# Patient Record
Sex: Male | Born: 1980 | Race: Black or African American | Hispanic: No | Marital: Married | Smoking: Current some day smoker
Health system: Southern US, Community
[De-identification: ages and names within clinical notes are randomized; demographics above are authoritative.]

## PROBLEM LIST (undated history)

## (undated) DIAGNOSIS — K469 Unspecified abdominal hernia without obstruction or gangrene: Secondary | ICD-10-CM

---

## 2013-11-01 ENCOUNTER — Emergency Department (HOSPITAL_BASED_OUTPATIENT_CLINIC_OR_DEPARTMENT_OTHER)
Admission: EM | Admit: 2013-11-01 | Discharge: 2013-11-01 | Disposition: A | Discharge status: Home / Self Care | Payer: Self-pay | Attending: Emergency Medicine | Admitting: Emergency Medicine

## 2013-11-01 ENCOUNTER — Encounter (HOSPITAL_BASED_OUTPATIENT_CLINIC_OR_DEPARTMENT_OTHER): Payer: Self-pay | Admitting: Emergency Medicine

## 2013-11-01 DIAGNOSIS — F17200 Nicotine dependence, unspecified, uncomplicated: Secondary | ICD-10-CM | POA: Insufficient documentation

## 2013-11-01 DIAGNOSIS — G500 Trigeminal neuralgia: Secondary | ICD-10-CM | POA: Insufficient documentation

## 2013-11-01 MED ORDER — CARBAMAZEPINE 200 MG PO TABS: ORAL_TABLET | ORAL | Status: DC

## 2013-11-01 NOTE — ED Provider Notes (Signed)
CSN: [id number]     Arrival date & time 11/01/13  0145 History   First MD Initiated Contact with Patient 11/01/13 0408     Chief Complaint  Patient presents with  . Headache     (Consider location/radiation/quality/duration/timing/severity/associated sxs/prior Treatment) HPI This is a 33 year old male with a week and a half history of pain on the right side of his head and face. The pain is paroxysmal, typically lasting about 20 seconds. He describes it as feeling like electricity or stabbing. It is located in about the ophthalmic and maxillary branches of the trigeminal nerve sparing the mandibular branch. The pain is often is triggered by touching the right side of his face or scalp and he finds it difficult to sleep on his right side. There is no associated visual change, motor deficit or sensory deficit. He did get lightheaded yesterday evening taking a shower but did not pass out. He states he is concerned because he has a friend who recently had a brain aneurysm rupture. He has been taking ibuprofen for the pain without significant relief. The pain has made it difficult to sleep. The paroxysms are severe at their worst.  History reviewed. No pertinent past medical history. History reviewed. No pertinent past surgical history. No family history on file. History  Substance Use Topics  . Smoking status: Current Some Day Smoker  . Smokeless tobacco: Not on file  . Alcohol Use: No    Review of Systems  All other systems reviewed and are negative.   Allergies  Review of patient's allergies indicates no known allergies.  Home Medications   Prior to Admission medications   Not on File   BP 159/89  Pulse 76  Temp(Src) 98.5 F (36.9 C) (Oral)  Resp 20  Ht 5\' 11"  (1.803 m)  Wt 260 lb (117.935 kg)  BMI 36.28 kg/m2  SpO2 98%  Physical Exam General: Well-developed, well-nourished male in no acute distress; appearance consistent with age of record HENT: normocephalic;  atraumatic Eyes: pupils equal, round and reactive to light; extraocular muscles intact Neck: supple Heart: regular rate and rhythm Lungs: clear to auscultation bilaterally Abdomen: soft; nondistended Extremities: No deformity; full range of motion Neurologic: Awake, alert and oriented; motor function intact in all extremities and symmetric; no facial droop; mild tenderness in the right zygomatic region, otherwise normal sensation of the face; normal coordination and speech; negative Romberg; normal finger to nose Skin: Warm and dry; no facial rash Psychiatric: Normal mood and affect    ED Course  Procedures (including critical care time)  MDM  History is consistent with trigeminal neuralgia we will start him on carbamazepine but he was advised that this requires long-term management and titration and we will refer him to neurology.    Hanley SeamenJohn L Dontay Harm, MD 11/01/13 [phone]

## 2013-11-01 NOTE — ED Notes (Signed)
C/o sharp rt side head pain and soreness x 1 1/2 weeks  Denies inj

## 2013-11-01 NOTE — Discharge Instructions (Signed)
Trigeminal Neuralgia Trigeminal neuralgia is a nerve disorder that causes sudden attacks of severe facial pain. It is caused by damage to the trigeminal nerve, a major nerve in the face. It is more common in women and in the elderly, although it can also happen in younger patients. Attacks last from a few seconds to several minutes and can occur from a couple of times per year to several times per day. Trigeminal neuralgia can be a very distressing and disabling condition. Surgery may be needed in very severe cases if medical treatment does not give relief. HOME CARE INSTRUCTIONS   If your caregiver prescribed medication to help prevent attacks, take as directed.  To help prevent attacks:  Chew on the unaffected side of the mouth.  Avoid touching your face.  Avoid blasts of hot or cold air.  Men may wish to grow a beard to avoid having to shave. SEEK IMMEDIATE MEDICAL CARE IF:  Pain is unbearable and your medicine does not help.  You develop new, unexplained symptoms (problems).  You have problems that may be related to a medication you are taking. Document Released: 06/02/2000 Document Revised: 08/28/2011 Document Reviewed: 04/02/2009 [employer] Patient Information 2014 [employer].  

## 2013-11-01 NOTE — ED Notes (Signed)
Right sided intermittent head pain for 1 1/2 weeks.  Very tender to touch. No rash.  Pt sts he got dizzy tonight in the shower tonight and "stumbled" out of it.  Caught himself on a table.

## 2014-05-22 ENCOUNTER — Emergency Department (HOSPITAL_BASED_OUTPATIENT_CLINIC_OR_DEPARTMENT_OTHER)
Admission: EM | Admit: 2014-05-22 | Discharge: 2014-05-22 | Disposition: A | Discharge status: Home / Self Care | Payer: Self-pay | Attending: Emergency Medicine | Admitting: Emergency Medicine

## 2014-05-22 ENCOUNTER — Emergency Department (HOSPITAL_BASED_OUTPATIENT_CLINIC_OR_DEPARTMENT_OTHER): Payer: Self-pay

## 2014-05-22 ENCOUNTER — Encounter (HOSPITAL_BASED_OUTPATIENT_CLINIC_OR_DEPARTMENT_OTHER): Payer: Self-pay

## 2014-05-22 DIAGNOSIS — W01198A Fall on same level from slipping, tripping and stumbling with subsequent striking against other object, initial encounter: Secondary | ICD-10-CM | POA: Insufficient documentation

## 2014-05-22 DIAGNOSIS — Z79899 Other long term (current) drug therapy: Secondary | ICD-10-CM | POA: Insufficient documentation

## 2014-05-22 DIAGNOSIS — S60512A Abrasion of left hand, initial encounter: Secondary | ICD-10-CM | POA: Insufficient documentation

## 2014-05-22 DIAGNOSIS — Y9289 Other specified places as the place of occurrence of the external cause: Secondary | ICD-10-CM | POA: Insufficient documentation

## 2014-05-22 DIAGNOSIS — S62347A Nondisplaced fracture of base of fifth metacarpal bone. left hand, initial encounter for closed fracture: Secondary | ICD-10-CM | POA: Insufficient documentation

## 2014-05-22 DIAGNOSIS — T1490XA Injury, unspecified, initial encounter: Secondary | ICD-10-CM

## 2014-05-22 DIAGNOSIS — Z23 Encounter for immunization: Secondary | ICD-10-CM | POA: Insufficient documentation

## 2014-05-22 DIAGNOSIS — Z791 Long term (current) use of non-steroidal anti-inflammatories (NSAID): Secondary | ICD-10-CM | POA: Insufficient documentation

## 2014-05-22 DIAGNOSIS — Y9389 Activity, other specified: Secondary | ICD-10-CM | POA: Insufficient documentation

## 2014-05-22 DIAGNOSIS — Y998 Other external cause status: Secondary | ICD-10-CM | POA: Insufficient documentation

## 2014-05-22 DIAGNOSIS — S62309A Unspecified fracture of unspecified metacarpal bone, initial encounter for closed fracture: Secondary | ICD-10-CM

## 2014-05-22 DIAGNOSIS — Z87891 Personal history of nicotine dependence: Secondary | ICD-10-CM | POA: Insufficient documentation

## 2014-05-22 MED ORDER — HYDROCODONE-ACETAMINOPHEN 5-325 MG PO TABS
1.0000 | ORAL_TABLET | Freq: Four times a day (QID) | ORAL | Status: DC | PRN
Start: 2014-05-22 — End: 2016-02-12

## 2014-05-22 MED ORDER — HYDROCODONE-ACETAMINOPHEN 5-325 MG PO TABS
1.0000 | ORAL_TABLET | Freq: Once | ORAL | Status: AC
  Administered 2014-05-22: 1 via ORAL

## 2014-05-22 MED ORDER — TETANUS-DIPHTH-ACELL PERTUSSIS 5-2.5-18.5 LF-MCG/0.5 IM SUSP
0.5000 mL | Freq: Once | INTRAMUSCULAR | Status: AC
  Administered 2014-05-22: 0.5 mL via INTRAMUSCULAR

## 2014-05-22 MED ORDER — NAPROXEN 500 MG PO TABS: 500.0000 mg | ORAL_TABLET | Freq: Two times a day (BID) | ORAL | Status: DC

## 2014-05-22 NOTE — ED Notes (Signed)
Pt reports was attempting to stop dog and fell, hand hit concrete, swelling and pain, abrasions to L hand.

## 2014-05-22 NOTE — Discharge Instructions (Signed)
Keep your splint in place and dry. Follow-up with orthopedic hand surgery. Dr. Janee Mornhompson and phone number and address provided above. Keep the hand elevated as much as possible for the next 2 days. Take pain medication and anti-inflammatory medicine as directed. Work note provided.

## 2014-05-22 NOTE — ED Notes (Signed)
Ulnar splint applied to left hand, + cms, able to move digits,

## 2014-05-22 NOTE — ED Provider Notes (Addendum)
CSN: [id number]     Arrival date & time 05/22/14  2019 History  This chart was scribed for Jonathan Mata Jonathan Dorce, MD by Abel PrestoKara Demonbreun, ED Scribe. This patient was seen in room MH12/MH12 and the patient's care was started at 10:22 PM.    Chief Complaint  Patient presents with  . Hand Injury     The history is provided by the patient. No language interpreter was used.    HPI Comments: Jonathan Mata is a 33 y.o. male who presents to the Emergency Department complaining of 9/10 left hand pain on lateral portion after falling on hand around 8:15pm. Pt was walking dog when he fell. Pt notes associated pain and abrasions on hand. Pt is left hand dominant. Pt has not had a tetanus shot in last 10 years. Pt denies LOC, fever, chills, cold symptoms, dysuria, abdominal, chest and back pain, and any other injuries.   History reviewed. No pertinent past medical history. History reviewed. No pertinent past surgical history. No family history on file. History  Substance Use Topics  . Smoking status: Former Games developermoker  . Smokeless tobacco: Not on file  . Alcohol Use: No    Review of Systems  Constitutional: Negative for fever and chills.  HENT: Negative for rhinorrhea and sore throat.   Eyes: Negative for visual disturbance.  Respiratory: Negative for cough and shortness of breath.   Cardiovascular: Negative for chest pain and leg swelling.  Gastrointestinal: Negative for nausea, vomiting, abdominal pain and diarrhea.  Genitourinary: Negative for dysuria.  Musculoskeletal: Negative for back pain and neck pain.  Skin: Negative for rash.  Neurological: Negative for syncope and headaches.  Hematological: Does not bruise/bleed easily.  Psychiatric/Behavioral: Negative for confusion.      Allergies  Review of patient's allergies indicates no known allergies.  Home Medications   Prior to Admission medications   Medication Sig Start Date End Date Taking? Authorizing Provider  carbamazepine  (TEGRETOL) 200 MG tablet 1 tablet daily for one week then start taking one twice daily. 11/01/13   Carlisle BeersJohn L Molpus, MD  HYDROcodone-acetaminophen (NORCO/VICODIN) 5-325 MG per tablet Take 1-2 tablets by mouth every 6 (six) hours as needed for moderate pain. 05/22/14   Jonathan Mata Adriahna Shearman, MD  naproxen (NAPROSYN) 500 MG tablet Take 1 tablet (500 mg total) by mouth 2 (two) times daily. 05/22/14   Jonathan Mata Makita Blow, MD   BP 110/82 mmHg  Pulse 92  Temp(Src) 98.4 F (36.9 C) (Oral)  Resp 18  Ht 5\' 11"  (1.803 m)  Wt 240 lb (108.863 kg)  BMI 33.49 kg/m2  SpO2 96% Physical Exam  Constitutional: He is oriented to person, place, and time. He appears well-developed and well-nourished.  HENT:  Head: Normocephalic.  Mouth/Throat: Oropharynx is clear and moist.  Eyes: Conjunctivae and EOM are normal. Pupils are equal, round, and reactive to light. No scleral icterus.  Neck: Normal range of motion. Neck supple.  Cardiovascular: Normal rate, regular rhythm and normal heart sounds.   No murmur heard. Pulses:      Radial pulses are 2+ on the left side.  Cap refill in fingertips 1 sec  Pulmonary/Chest: Effort normal and breath sounds normal. No respiratory distress. He has no wheezes. He has no rales.  Abdominal: Soft. Bowel sounds are normal. There is no tenderness.  Musculoskeletal: Normal range of motion. He exhibits no edema (in ankles).  Swelling at base of 4th and 5th metacarpals Abrasions on dorsum of hand  ROM normal  Neurological: He is alert and oriented to person,  place, and time. No cranial nerve deficit. He exhibits normal muscle tone. Coordination normal.  Sensations intact  Skin: Skin is warm and dry.  Psychiatric: He has a normal mood and affect. His behavior is normal.  Nursing note and vitals reviewed.   ED Course  Procedures (including critical care time) DIAGNOSTIC STUDIES: Oxygen Saturation is 96% on room air, normal by my interpretation.    COORDINATION OF CARE: 10:28 PM Discussed  treatment plan with patient at beside, the patient agrees with the plan and has no further questions at this time.     Labs Review Labs Reviewed - No data to display  Imaging Review Dg Hand Complete Left  05/22/2014   CLINICAL DATA:  33 year old male with history of trauma from a fall while trying to stop a dog, with injury to the left hand on concrete complaining of pain and swelling in the left hand. Pain is most severe in the left ring finger and fifth finger.  EXAM: LEFT HAND - COMPLETE 3+ VIEW  COMPARISON:  No priors.  FINDINGS: Nondisplaced comminuted fracture through the base of the fifth metacarpal, with extensive overlying soft tissue swelling. No other acute displaced fracture, subluxation or dislocation is noted.  IMPRESSION: Nondisplaced comminuted fracture through the base of the fifth metacarpal.   Electronically Signed   By: Trudie Reedaniel  Entrikin M.D.   On: 05/22/2014 21:33     EKG Interpretation None      MDM   Final diagnoses:  Metacarpal bone fracture, closed, initial encounter      Patient with a fall resulting in fifth the carpal base fracture. Closed fracture. Neurovascularly intact good range of motion of the fingers. Will be splinted with a short arm splint predominantly ulnar gutter. Some abrasions in the webspaces between the fifth and fourth and third fingers. They are not deep laceration superficially did not occur from dog bite. Patient tetanus updated here today. Hand was soaked in local wound care provided. Referral and follow-up with orthopedics. No other injuries.  I personally performed the services described in this documentation, which was scribed in my presence. The recorded information has been reviewed and is accurate.    Jonathan Mata [name], MD 05/22/14 [id number]  Jonathan Mata [name], MD 05/22/14 2306

## 2016-02-11 ENCOUNTER — Emergency Department (HOSPITAL_BASED_OUTPATIENT_CLINIC_OR_DEPARTMENT_OTHER): Payer: Self-pay

## 2016-02-11 ENCOUNTER — Encounter (HOSPITAL_BASED_OUTPATIENT_CLINIC_OR_DEPARTMENT_OTHER): Payer: Self-pay | Admitting: *Deleted

## 2016-02-11 ENCOUNTER — Observation Stay (HOSPITAL_BASED_OUTPATIENT_CLINIC_OR_DEPARTMENT_OTHER)
Admission: EM | Admit: 2016-02-11 | Discharge: 2016-02-12 | Disposition: A | Discharge status: Home / Self Care | Payer: Self-pay | Attending: Internal Medicine | Admitting: Internal Medicine

## 2016-02-11 DIAGNOSIS — R531 Weakness: Secondary | ICD-10-CM | POA: Insufficient documentation

## 2016-02-11 DIAGNOSIS — R079 Chest pain, unspecified: Secondary | ICD-10-CM | POA: Diagnosis present

## 2016-02-11 DIAGNOSIS — R42 Dizziness and giddiness: Secondary | ICD-10-CM

## 2016-02-11 DIAGNOSIS — R9431 Abnormal electrocardiogram [ECG] [EKG]: Principal | ICD-10-CM | POA: Insufficient documentation

## 2016-02-11 DIAGNOSIS — N179 Acute kidney failure, unspecified: Secondary | ICD-10-CM

## 2016-02-11 DIAGNOSIS — Z87891 Personal history of nicotine dependence: Secondary | ICD-10-CM | POA: Insufficient documentation

## 2016-02-11 DIAGNOSIS — R0789 Other chest pain: Secondary | ICD-10-CM | POA: Insufficient documentation

## 2016-02-11 DIAGNOSIS — G4733 Obstructive sleep apnea (adult) (pediatric): Secondary | ICD-10-CM | POA: Insufficient documentation

## 2016-02-11 LAB — HEPATIC FUNCTION PANEL
ALBUMIN: 4 g/dL (ref 3.5–5.0)
ALT: 34 U/L (ref 17–63)
AST: 24 U/L (ref 15–41)
Alkaline Phosphatase: 61 U/L (ref 38–126)
BILIRUBIN TOTAL: 0.1 mg/dL — AB (ref 0.3–1.2)
Bilirubin, Direct: <0.1 mg/dL — ABNORMAL LOW (ref 0.1–0.5)
TOTAL PROTEIN: 7.6 g/dL (ref 6.5–8.1)

## 2016-02-11 LAB — CBC
HCT: 41.7 % (ref 39.0–52.0)
Hemoglobin: 13.8 g/dL (ref 13.0–17.0)
MCH: 27 pg (ref 26.0–34.0)
MCHC: 33.1 g/dL (ref 30.0–36.0)
MCV: 81.4 fL (ref 78.0–100.0)
Platelets: 213 10*3/uL (ref 150–400)
RBC: 5.12 MIL/uL (ref 4.22–5.81)
RDW: 14 % (ref 11.5–15.5)
WBC: 8.4 10*3/uL (ref 4.0–10.5)

## 2016-02-11 LAB — TROPONIN I

## 2016-02-11 LAB — BASIC METABOLIC PANEL
Anion gap: 6 (ref 5–15)
BUN: 12 mg/dL (ref 6–20)
CHLORIDE: 106 mmol/L (ref 101–111)
CO2: 29 mmol/L (ref 22–32)
Calcium: 9.4 mg/dL (ref 8.9–10.3)
Creatinine, Ser: 1.44 mg/dL — ABNORMAL HIGH (ref 0.61–1.24)
GFR calc Af Amer: >60 mL/min (ref >60)
GFR calc non Af Amer: >60 mL/min (ref >60)
Glucose, Bld: 104 mg/dL — ABNORMAL HIGH (ref 65–99)
POTASSIUM: 3.9 mmol/L (ref 3.5–5.1)
Sodium: 141 mmol/L (ref 135–145)

## 2016-02-11 LAB — CBG MONITORING, ED: Glucose-Capillary: 105 mg/dL — ABNORMAL HIGH (ref 65–99)

## 2016-02-11 LAB — RAPID URINE DRUG SCREEN, HOSP PERFORMED
Amphetamines: NOT DETECTED
Barbiturates: NOT DETECTED
Benzodiazepines: NOT DETECTED
Cocaine: NOT DETECTED
Opiates: NOT DETECTED
Tetrahydrocannabinol: NOT DETECTED

## 2016-02-11 LAB — URINALYSIS, ROUTINE W REFLEX MICROSCOPIC
BILIRUBIN URINE: NEGATIVE
GLUCOSE, UA: NEGATIVE mg/dL
Hgb urine dipstick: NEGATIVE
KETONES UR: NEGATIVE mg/dL
LEUKOCYTES UA: NEGATIVE
Nitrite: NEGATIVE
Protein, ur: NEGATIVE mg/dL
Specific Gravity, Urine: 1.027 (ref 1.005–1.030)
pH: 5.5 (ref 5.0–8.0)

## 2016-02-11 NOTE — ED Notes (Signed)
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 The patient reports that he started to have a headache a few days ago to the right side of his head. Patient denies any other problems

## 2016-02-11 NOTE — ED Triage Notes (Signed)
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 He was feeling better after getting home. Tonight while eating he had severe head pain, dizziness and chest pain. States his left hand has been getting numb and off and on since last night. Vomiting tonight.

## 2016-02-11 NOTE — ED Provider Notes (Signed)
[facility] Provider Note   CSN: [id number] Arrival date & time: 02/11/16  2122  By signing my name below, I, Soijett Blue, attest that this documentation has been prepared under the direction and in the presence of Arby Barrette, MD. Electronically Signed: Soijett Blue, ED Scribe. 02/11/16. 10:16 PM.    History   Chief Complaint Chief Complaint  Patient presents with  . Dizziness    HPI  Jonathan Mata is a 35 y.o. male who presents to the Emergency Department complaining of dizziness onset last night. 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 He states that he is having associated symptoms of mild HA, mild resolved CP, intermittent left sided numbness x last night, and generalized weakness. Pt notes that he had CP that has since resolved. He states that he has not tried any medications for the relief for his symptoms. He denies SOB, and any other symptoms. Denies ETOH or cigarette smoker. Denies family medical issues of cardiac issues. Denies PMHx of HTN, DM, or high cholesterol.      The history is provided by the patient. No language interpreter was used.    History reviewed. No pertinent past medical history.  Patient Active Problem List   Diagnosis Date Noted  . Chest pain 02/12/2016    History reviewed. No pertinent surgical history.     Home Medications    Prior to Admission medications   Medication Sig Start Date End Date Taking? Authorizing Provider  carbamazepine (TEGRETOL) 200 MG tablet 1 tablet daily for one week then start taking one twice daily. 11/01/13   John Molpus, MD  HYDROcodone-acetaminophen (NORCO/VICODIN) 5-325 MG per tablet Take 1-2 tablets by mouth every 6 (six) hours as needed for moderate pain. 05/22/14   Vanetta Mulders, MD  naproxen (NAPROSYN) 500 MG tablet Take 1 tablet (500 mg  total) by mouth 2 (two) times daily. 05/22/14   Vanetta Mulders, MD    Family History No family history on file.  Social History Social History  Substance Use Topics  . Smoking status: Current Some Day Smoker  . Smokeless tobacco: Never Used  . Alcohol use No     Allergies   Review of patient's allergies indicates no known allergies.   Review of Systems Review of Systems  Respiratory: Negative for shortness of breath.   Cardiovascular: Positive for chest pain (resolved).  Neurological: Positive for dizziness, weakness, numbness and headaches.   10 Systems reviewed and are negative for acute change except as noted in the HPI.  Physical Exam Updated Vital Signs BP 137/87   Pulse (!) 58   Temp 98 F (36.7 C)   Resp 18   Ht 5\' 11"  (1.803 m)   Wt 240 lb (108.9 kg)   SpO2 98%   BMI 33.47 kg/m   Physical Exam  Constitutional: He is oriented to person, place, and time. He appears well-developed and well-nourished. No distress.  HENT:  Head: Normocephalic and atraumatic.  Eyes: EOM are normal.  Neck: Neck supple.  Cardiovascular: Normal rate, regular rhythm and normal heart sounds.  Exam reveals no gallop and no friction rub.   No murmur heard. Pulmonary/Chest: Effort normal and breath sounds normal. No respiratory distress. He has no wheezes. He has no rales.  Abdominal: Soft. He exhibits no distension. There is no tenderness.  Musculoskeletal: Normal range of motion.  Neurological: He is alert and oriented to person, place, and time. He exhibits normal muscle tone. Coordination normal.  No abnormal coordination or tone. 5/5 strength in bilateral upper and lower extremities. No pronator drift. Nl finger-to-nose bilaterally. Sensation intact to light touch.   Skin: Skin is warm and dry.  Psychiatric: He has a normal mood and affect. His behavior is normal.  Nursing note and vitals reviewed.    ED Treatments / Results  DIAGNOSTIC STUDIES: Oxygen Saturation is 98% on  RA, nl by my interpretation.    COORDINATION OF CARE: 10:16 PM Discussed treatment plan with pt at bedside which includes labs, UA, EKG, and pt agreed to plan.   Labs (all labs ordered are listed, but only abnormal results are displayed) Labs Reviewed  BASIC METABOLIC PANEL - Abnormal; Notable for the following:       Result Value   Glucose, Bld 104 (*)    Creatinine, Ser 1.44 (*)    All other components within normal limits  HEPATIC FUNCTION PANEL - Abnormal; Notable for the following:    Total Bilirubin 0.1 (*)    Bilirubin, Direct <0.1 (*)    All other components within normal limits  CBG MONITORING, ED - Abnormal; Notable for the following:    Glucose-Capillary 105 (*)    All other components within normal limits  CBC  URINALYSIS, ROUTINE W REFLEX MICROSCOPIC (NOT AT [facility])  TROPONIN I  URINE RAPID DRUG SCREEN, HOSP PERFORMED    EKG  EKG Interpretation  Date/Time:  Friday February 11 2016 21:30:28 EDT Ventricular Rate:  75 PR Interval:  120 QRS Duration: 94 QT Interval:  340 QTC Calculation: 379 R Axis:   12 Text Interpretation:  Normal sinus rhythm T wave abnormality, consider inferior ischemia T wave abnormality, consider anterolateral ischemia Abnormal ECG ischemic appearance. no old comparison Confirmed by Donnald GarrePfeiffer, MD, Lebron ConnersMarcy [phone]([id number]) on 02/11/2016 9:37:36 PM       Radiology Ct Head Wo Contrast  Result Date: 02/11/2016 CLINICAL DATA:  Posterior head pain. Dizziness. Left hand numbness. Vomiting EXAM: CT HEAD WITHOUT CONTRAST TECHNIQUE: Contiguous axial images were obtained from the base of the skull through the vertex without intravenous contrast. COMPARISON:  None. FINDINGS: Brain: There is no mass lesion, intraparenchymal hemorrhage or extra-axial collection. No evidence of acute cortical infarct. No hydrocephalus. Brain parenchyma and CSF-containing spaces are normal for age. Vascular: No hyperdense vessel or unexpected calcification. Skull: Normal Sinuses/Orbits:  Sinuses and mastoids are clear. Visualized orbits are normal. Other: The visualized extracranial soft tissues are unremarkable. Nasopharyngeal fullness is likely secondary to prominent adenoidal tissue. IMPRESSION: Normal head CT. Electronically Signed   By: Deatra RobinsonKevin  Herman M.D.   On: 02/11/2016 22:25    Procedures Procedures (including critical care time)  Medications Ordered in ED Medications  aspirin chewable tablet 324 mg (not administered)  acetaminophen (TYLENOL) tablet 1,000 mg (not administered)     Initial Impression / Assessment and Plan / ED Course  I have reviewed the triage vital signs and the nursing notes.  Pertinent labs & imaging results that were available during my care of the patient were reviewed by me and considered in my medical decision making (see chart for details).  Clinical Course   Consult :Dr. Clyde LundborgNiu for admission   Final Clinical Impressions(s) / ED Diagnoses   Final diagnoses:  EKG abnormalities  Chest pressure  Dizziness   Patient has had a constellation of symptoms that initially began with feeling of general weakness  with variable feeling of left  arm numbness and chest pressure. Patient reports at the time he felt terrible. Severity of this has waxed and waned. He is also intermittently had headache that has waxed and waned. No focal neurologic dysfunction. Patient does have abnormal EKG with nonspecific T-wave changes. At this time my main concern is for cardiac possible ischemic symptoms waxing and waning. The patient is currently chest pain-free and has no evidence of neurologic dysfunction. Plan will be for hospital observation. New Prescriptions New Prescriptions   No medications on file       Arby Barrette, MD 02/12/16 [phone]

## 2016-02-12 ENCOUNTER — Observation Stay (HOSPITAL_COMMUNITY): Payer: Self-pay

## 2016-02-12 ENCOUNTER — Encounter (HOSPITAL_COMMUNITY): Payer: Self-pay | Admitting: *Deleted

## 2016-02-12 ENCOUNTER — Observation Stay (HOSPITAL_BASED_OUTPATIENT_CLINIC_OR_DEPARTMENT_OTHER): Payer: Self-pay

## 2016-02-12 DIAGNOSIS — R079 Chest pain, unspecified: Secondary | ICD-10-CM

## 2016-02-12 DIAGNOSIS — N179 Acute kidney failure, unspecified: Secondary | ICD-10-CM

## 2016-02-12 LAB — LIPASE, BLOOD: LIPASE: 15 U/L (ref 11–51)

## 2016-02-12 LAB — TROPONIN I

## 2016-02-12 LAB — NM MYOCAR MULTI W/SPECT W/WALL MOTION / EF
CHL CUP MPHR: 186 {beats}/min
CHL CUP RESTING HR STRESS: 68 {beats}/min
CSEPED: 5 min
CSEPEW: 1 METS
Exercise duration (sec): 36 s
Peak HR: 131 {beats}/min
Percent HR: 70 %

## 2016-02-12 LAB — D-DIMER, QUANTITATIVE: D-Dimer, Quant: <0.27 ug/mL-FEU (ref 0.00–0.50)

## 2016-02-12 MED ORDER — SODIUM CHLORIDE 0.9 % IV SOLN
INTRAVENOUS | Status: DC
  Administered 2016-02-12: 11:00:00 via INTRAVENOUS

## 2016-02-12 MED ORDER — ONDANSETRON HCL 4 MG/2ML IJ SOLN: 4.0000 mg | Freq: Four times a day (QID) | INTRAMUSCULAR | Status: DC | PRN

## 2016-02-12 MED ORDER — ENOXAPARIN SODIUM 40 MG/0.4ML ~~LOC~~ SOLN
40.0000 mg | SUBCUTANEOUS | Status: DC
  Administered 2016-02-12: 40 mg via SUBCUTANEOUS
  Filled 2016-02-12: qty 0.4

## 2016-02-12 MED ORDER — ACETAMINOPHEN 325 MG PO TABS: 650.0000 mg | ORAL_TABLET | ORAL | Status: DC | PRN

## 2016-02-12 MED ORDER — ASPIRIN 81 MG PO CHEW
324.0000 mg | CHEWABLE_TABLET | Freq: Once | ORAL | Status: AC
  Administered 2016-02-12: 324 mg via ORAL
  Filled 2016-02-12: qty 4

## 2016-02-12 MED ORDER — OMEPRAZOLE 40 MG PO CPDR: 40.0000 mg | DELAYED_RELEASE_CAPSULE | Freq: Every day | ORAL | 0 refills | Status: AC

## 2016-02-12 MED ORDER — REGADENOSON 0.4 MG/5ML IV SOLN
0.4000 mg | Freq: Once | INTRAVENOUS | Status: AC
  Administered 2016-02-12: 0.4 mg via INTRAVENOUS
  Filled 2016-02-12: qty 5

## 2016-02-12 MED ORDER — MORPHINE SULFATE (PF) 2 MG/ML IV SOLN: 2.0000 mg | INTRAVENOUS | Status: DC | PRN

## 2016-02-12 MED ORDER — ACETAMINOPHEN 325 MG PO TABS: 650.0000 mg | ORAL_TABLET | ORAL | 0 refills | Status: AC | PRN

## 2016-02-12 MED ORDER — FAMOTIDINE 20 MG PO TABS
20.0000 mg | ORAL_TABLET | Freq: Every day | ORAL | Status: DC
Start: 2016-02-12 — End: 2016-02-12
  Administered 2016-02-12: 20 mg via ORAL
  Filled 2016-02-12: qty 1

## 2016-02-12 MED ORDER — GI COCKTAIL ~~LOC~~: 30.0000 mL | Freq: Four times a day (QID) | ORAL | Status: DC | PRN

## 2016-02-12 MED ORDER — ALPRAZOLAM 0.25 MG PO TABS: 0.2500 mg | ORAL_TABLET | Freq: Two times a day (BID) | ORAL | Status: DC | PRN

## 2016-02-12 MED ORDER — ACETAMINOPHEN 500 MG PO TABS
1000.0000 mg | ORAL_TABLET | Freq: Once | ORAL | Status: AC
  Administered 2016-02-12: 1000 mg via ORAL
  Filled 2016-02-12: qty 2

## 2016-02-12 MED ORDER — ASPIRIN EC 81 MG PO TBEC
81.0000 mg | DELAYED_RELEASE_TABLET | Freq: Every day | ORAL | Status: DC
  Administered 2016-02-12: 81 mg via ORAL
  Filled 2016-02-12: qty 1

## 2016-02-12 MED ORDER — TECHNETIUM TC 99M TETROFOSMIN IV KIT
30.0000 | PACK | Freq: Once | INTRAVENOUS | Status: AC | PRN
  Administered 2016-02-12: 30 via INTRAVENOUS

## 2016-02-12 MED ORDER — ZOLPIDEM TARTRATE 5 MG PO TABS: 5.0000 mg | ORAL_TABLET | Freq: Every evening | ORAL | Status: DC | PRN

## 2016-02-12 MED ORDER — TECHNETIUM TC 99M TETROFOSMIN IV KIT
10.0000 | PACK | Freq: Once | INTRAVENOUS | Status: AC | PRN
  Administered 2016-02-12: 10 via INTRAVENOUS

## 2016-02-12 MED ORDER — REGADENOSON 0.4 MG/5ML IV SOLN
INTRAVENOUS | Status: AC
  Filled 2016-02-12: qty 5

## 2016-02-12 NOTE — Progress Notes (Signed)
[facility] Broadus JohnPfieffer  35 year old man, without a significant past medical history, who presents with pressure-like chest pain, left-handed numbness, headache, dizziness. Also has nausea no vomiting. Pt is a [employer] architecttruck driver.  Troponin negative, EKG has T-wave inversion in V3-V6 and inferior leads, negative UDS, negative urinalysis, WBCs 8.4, AKI with Cre 1.44. CT head is negative for acute intracranial abnormalities. Pt is accepted to tele bed for obs.  Lorretta HarpXilin Jaque Dacy, MD  [facility] Pager [phone]  If 7PM-7AM, please contact night-coverage [email or web address] Password [email or web address] 02/12/2016, 1:22 AM

## 2016-02-12 NOTE — H&P (Signed)
History and Physical    Jonathan Cocontoine Shelp ZOX:[id number] DOB: 03/12/1981 DOA: 02/11/2016   PCP: No PCP Per Patient   Patient coming from:  Home   Chief Complaint: Chest pain   HPI: Jonathan Mata is a 35 y.o. male without documented prior medical history, transferred [facility] after presenting with pressure-like chest pain, L handed tingling, headache and dizziness. Patient took ASA 324 mg on arrival, and then given NTG with no significant relief. Since admission, he continues to have intermittent  Non-specific chest discomfort  episodes. Denies any falls. Denies dyspnea or cough or pleuritic chest pain . Denies any fever or chills. Denies any nausea, vomiting or abdominal pain. Appetite is normal andeats salt rich foods. Denies any leg swelling or calf pain. Denies any vision changes. Denies any seizures or confusion. Never seen by a cardiologist. 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 In addition, he had a death in the family 3 weeks ago. No new meds. Not on hormonal  or herbal supplements. He smoked until 1 week ago 1 1/2-2 ppd  No ETOH or recreational drugs. Recent tooth abscess removal    ED Course:  BP 133/82 (BP Location: Left Arm)   Pulse 68   Temp 98.5 F (36.9 C) (Oral)   Resp 17   Ht 5\' 11"  (1.803 m)   Wt 108.9 kg (240 lb)   SpO2 100%   BMI 33.47 kg/m   EKG with T-wave inversion in V3-C6 and inferior leads Urine drug screen is negative  Troponin negative, Urinalysis is negative White count is normal at 8.4 Creatinine 1.44 CT of the head negative for acute intracranial abnormalities  Review of Systems: As per HPI otherwise 10 point review of systems negative.   History reviewed. No pertinent past medical history.  History reviewed. No pertinent surgical history.  Social History Social History   Social History  . Marital status: Single    Spouse name: N/A  . Number of  children: N/A  . Years of education: N/A   Occupational History  . truckdriver    Social History Main Topics  . Smoking status: Current Some Day Smoker  . Smokeless tobacco: Never Used  . Alcohol use No  . Drug use: No  . Sexual activity: Yes   Other Topics Concern  . Not on file   Social History Narrative  . No narrative on file     No Known Allergies  Family History  Problem Relation Age of Onset  . Heart murmur Mother   . Hypertension Mother       Prior to Admission medications   Medication Sig Start Date End Date Taking? Authorizing Provider  carbamazepine (TEGRETOL) 200 MG tablet 1 tablet daily for one week then start taking one twice daily. 11/01/13   John Molpus, MD  HYDROcodone-acetaminophen (NORCO/VICODIN) 5-325 MG per tablet Take 1-2 tablets by mouth every 6 (six) hours as needed for moderate pain. 05/22/14   Vanetta MuldersScott Zackowski, MD  naproxen (NAPROSYN) 500 MG tablet Take 1 tablet (500 mg total) by mouth 2 (two) times daily. 05/22/14   Vanetta MuldersScott Zackowski, MD    Physical Exam:    Vitals:   02/12/16 0409 02/12/16 0430 02/12/16 0500 02/12/16 0603  BP: 131/98 130/84 125/86 133/82  Pulse: 65 64 67 68  Resp: 16 17 19 17   Temp:    98.5 F (36.9 C)  TempSrc:    Oral  SpO2: 99% 98% 98% 100%  Weight:      Height:           Constitutional: NAD, calm, comfortable   Vitals:   02/12/16 0409 02/12/16 0430 02/12/16 0500 02/12/16 0603  BP: 131/98 130/84 125/86 133/82  Pulse: 65 64 67 68  Resp: 16 17 19 17   Temp:    98.5 F (36.9 C)  TempSrc:    Oral  SpO2: 99% 98% 98% 100%  Weight:      Height:       Eyes: PERRL, lids and conjunctivae normal ENMT: Mucous membranes are moist. Posterior pharynx clear of any exudate or lesions.Normal dentition.  Neck: normal, supple, no masses, no thyromegaly Respiratory: clear to auscultation bilaterally, no wheezing, no crackles. Normal respiratory effort. No accessory muscle use.  Cardiovascular: Regular rate and rhythm, no  murmurs / rubs / gallops. No extremity edema. 2+ pedal pulses. No carotid bruits.  Abdomen:  Obese, tenderness, no masses palpated. No hepatosplenomegaly. Bowel sounds positive.  Musculoskeletal: no clubbing / cyanosis. No joint deformity upper and lower extremities. Good ROM, no contractures. Normal muscle tone.  Skin: no rashes, lesions, ulse cers.  Neurologic: CN 2-12 grossly intact. Sensation intact, DTR normal. Strength 5/5 in all 4.  Psychiatric: Normal judgment and insight. Alert and oriented x 3. Normal mood.     Labs on Admission: I have personally reviewed following labs and imaging studies  CBC:  Recent Labs Lab 02/11/16 2200  WBC 8.4  HGB 13.8  HCT 41.7  MCV 81.4  PLT 213    Basic Metabolic Panel:  Recent Labs Lab 02/11/16 2200  NA 141  K 3.9  CL 106  CO2 29  GLUCOSE 104*  BUN 12  CREATININE 1.44*  CALCIUM 9.4    GFR: Estimated Creatinine Clearance: 90.7 mL/min (by C-G formula based on SCr of 1.44 mg/dL).  Liver Function Tests:  Recent Labs Lab 02/11/16 2200  AST 24  ALT 34  ALKPHOS 61  BILITOT 0.1*  PROT 7.6  ALBUMIN 4.0   No results for input(s): LIPASE, AMYLASE in the last 168 hours. No results for input(s): AMMONIA in the last 168 hours.  Coagulation Profile: No results for input(s): INR, PROTIME in the last 168 hours.  Cardiac Enzymes:  Recent Labs Lab 02/11/16 2200  TROPONINI <0.03    BNP (last 3 results) No results for input(s): PROBNP in the last 8760 hours.  HbA1C: No results for input(s): HGBA1C in the last 72 hours.  CBG:  Recent Labs Lab 02/11/16 2200  GLUCAP 105*    Lipid Profile: No results for input(s): CHOL, HDL, LDLCALC, TRIG, CHOLHDL, LDLDIRECT in the last 72 hours.  Thyroid Function Tests: No results for input(s): TSH, T4TOTAL, FREET4, T3FREE, THYROIDAB in the last 72 hours.  Anemia Panel: No results for input(s): VITAMINB12, FOLATE, FERRITIN, TIBC, IRON, RETICCTPCT in the last 72 hours.  Urine  analysis:    Component Value Date/Time   COLORURINE YELLOW 02/11/2016 2150   APPEARANCEUR CLEAR 02/11/2016 2150   LABSPEC 1.027 02/11/2016 2150   PHURINE 5.5 02/11/2016 2150   GLUCOSEU NEGATIVE 02/11/2016 2150   HGBUR NEGATIVE 02/11/2016 2150   BILIRUBINUR NEGATIVE 02/11/2016 2150   KETONESUR NEGATIVE 02/11/2016 2150   PROTEINUR NEGATIVE 02/11/2016 2150   NITRITE NEGATIVE 02/11/2016 2150   LEUKOCYTESUR NEGATIVE 02/11/2016 2150    Sepsis Labs: @LABRCNTIP (procalcitonin:4,lacticidven:4) )No results found for this or any previous visit (from the past 240 hour(s)).   Radiological Exams on Admission: Ct Head Wo Contrast  Result Date: 02/11/2016 CLINICAL DATA:  Posterior head pain. Dizziness. Left hand numbness. Vomiting EXAM: CT HEAD WITHOUT CONTRAST TECHNIQUE: Contiguous axial images were obtained from the base of the skull through the vertex without intravenous contrast. COMPARISON:  None. FINDINGS: Brain: There is no mass lesion, intraparenchymal hemorrhage or extra-axial collection. No evidence of acute cortical infarct. No hydrocephalus. Brain parenchyma and CSF-containing spaces are normal for age. Vascular: No hyperdense vessel or unexpected calcification. Skull: Normal Sinuses/Orbits: Sinuses and mastoids are clear. Visualized orbits are normal. Other: The visualized extracranial soft tissues are unremarkable. Nasopharyngeal fullness is likely secondary to prominent adenoidal tissue. IMPRESSION: Normal head CT. Electronically Signed   By: Deatra Robinson M.D.   On: 02/11/2016 22:25    EKG: Independently reviewed.  Assessment/Plan Active Problems:   Chest pain   AKI (acute kidney injury) (HCC)  Chest pain syndrome, cardiac versus musculoskeletal vs anxiety  HEART score 3-4  . Troponin neg to date, other Tn pending , EKG with T-wave inversion in V3-C6 and inferior leads.  UDS negative. Recent tooth abscess removal . RF include obesity, stress, sedentary lifestyle, recent tobacco,  possible family history of heart disease.   Admit to Telemetry/ Observation Chest pain order set CXR  Cycle troponins serial EKG and EKG in am continue ASA, O2 and NTG as needed Statins  2 D echo  GI cocktail Check Lipid panel  Hb A1C D Dimer May consider Cards evaluation id any EKG changes or Tn elevation as discussed with Dr. York Spaniel  Xanax prn Ambien prn.   Acute kidney injury  admits to decreased liquid oral intake.  No BL Cr available . Peak Cr 1.44 IVF  Repeat CMET in am   DVT prophylaxis: Lovenox Code Status:   Full   Family Communication:  Discussed with patient Disposition Plan: Expect patient to be discharged to home after condition improves Consults called:    Cardiology Admission status:Tele  Obs   [facility] E, PA-C [facility]   If 7PM-7AM, please contact night-coverage [email or web address] Password [email or web address]  02/12/2016, 8:11 AM

## 2016-02-12 NOTE — Progress Notes (Addendum)
Data reviewed, pt brief eval. No chest pain, ez neg MI > 12 hr after onset of pain, ECG not acute.  OK to do Lexiscan, 1 day study, [place] to read.   Theodore DemarkBarrett, Moorea Boissonneault, PA-C 02/12/2016 10:07 AM Beeper [phone]

## 2016-02-12 NOTE — Discharge Summary (Signed)
Physician Discharge Summary  Jonathan Mata [id number] DOB: 1980/07/17 DOA: 02/11/2016  PCP: No PCP Per Patient  Admit date: 02/11/2016 Discharge date: 02/12/2016  Time spent: >35 minutes  Recommendations for Outpatient Follow-up:  PCP in 3-7 days  Discharge Diagnoses:  Active Problems:   Chest pain   AKI (acute kidney injury) [facility](HCC)   Discharge Condition: stable   Diet recommendation: regular   Filed Weights   02/11/16 2126  Weight: 108.9 kg (240 lb)    History of present illness:  35 y/o male with PMH of Obesity, OSA on CPAP (non complaint), GERD presented with substernal chest pressure radiating to his left arm lasting few seconds,  associated with diaphoresis, shortness of breath, nausea and two episode of vomiting. Admitted with chest pain r/o.    Hospital Course:   Atypical presentation likely related to GERD. Chest pain resolved prior to admission  -ruled out ACS. trop is neg. Underwent lexiscan:  No reversible ischemia or infarction  Mild AKI. No previous renal function is available. Probable due to vomiting. Patient received ivf, discontinued nsaid and recommended to cont hydration and f/u with PCP in 1-2 weeks   Nausea, vomiting. Resolved. No new episodes. abd exam is benign. LFTs, bili-unremarkable. Cont PPI for GERD. Lipase is pend   Procedures:  lexiscan-No reversible ischemia or infarction  (i.e. Studies not automatically included, echos, thoracentesis, etc; not x-rays)  Consultations:  None   Discharge Exam: Vitals:   02/12/16 1005 02/12/16 1007  BP: (!) 155/103 (!) 167/101  Pulse: (!) 109 (!) 107  Resp:    Temp:      General: alert, no distress  Cardiovascular: s1,s2 rrr Respiratory: CTA BL   Discharge Instructions  Discharge Instructions    Diet - low sodium heart healthy    Complete by:  As directed   Increase activity slowly    Complete by:  As directed       Medication List    STOP taking these medications   carbamazepine 200  MG tablet Commonly known as:  TEGRETOL   HYDROcodone-acetaminophen 5-325 MG tablet Commonly known as:  NORCO/VICODIN   naproxen 500 MG tablet Commonly known as:  NAPROSYN     TAKE these medications   acetaminophen 325 MG tablet Commonly known as:  TYLENOL Take 2 tablets (650 mg total) by mouth every 4 (four) hours as needed for headache or mild pain.   omeprazole 40 MG capsule Commonly known as:  PRILOSEC Take 1 capsule (40 mg total) by mouth daily.      No Known Allergies    The results of significant diagnostics from this hospitalization (including imaging, microbiology, ancillary and laboratory) are listed below for reference.    Significant Diagnostic Studies: Dg Chest 2 View  Result Date: 02/12/2016 CLINICAL DATA:  Chest pain and left arm pain and numbness since last night. EXAM: CHEST  2 VIEW COMPARISON:  None. FINDINGS: The heart size and mediastinal contours are within normal limits. There is no focal infiltrate, pulmonary edema, or pleural effusion. The visualized skeletal structures are unremarkable. IMPRESSION: No active cardiopulmonary disease. Electronically Signed   By: Sherian ReinWei-Chen  Lin M.D.   On: 02/12/2016 11:09   Ct Head Wo Contrast  Result Date: 02/11/2016 CLINICAL DATA:  Posterior head pain. Dizziness. Left hand numbness. Vomiting EXAM: CT HEAD WITHOUT CONTRAST TECHNIQUE: Contiguous axial images were obtained from the base of the skull through the vertex without intravenous contrast. COMPARISON:  None. FINDINGS: Brain: There is no mass lesion, intraparenchymal hemorrhage or extra-axial collection.  No evidence of acute cortical infarct. No hydrocephalus. Brain parenchyma and CSF-containing spaces are normal for age. Vascular: No hyperdense vessel or unexpected calcification. Skull: Normal Sinuses/Orbits: Sinuses and mastoids are clear. Visualized orbits are normal. Other: The visualized extracranial soft tissues are unremarkable. Nasopharyngeal fullness is likely  secondary to prominent adenoidal tissue. IMPRESSION: Normal head CT. Electronically Signed   By: Deatra Robinson M.D.   On: 02/11/2016 22:25   Nm Myocar Multi W/spect W/wall Motion / Ef  Result Date: 02/12/2016 CLINICAL DATA:  35 year old male with chest pain. EXAM: MYOCARDIAL IMAGING WITH SPECT (REST AND PHARMACOLOGIC-STRESS) GATED LEFT VENTRICULAR WALL MOTION STUDY LEFT VENTRICULAR EJECTION FRACTION TECHNIQUE: Standard myocardial SPECT imaging was performed after resting intravenous injection of 10 mCi Tc-57m tetrofosmin. Subsequently, intravenous infusion of Lexiscan was performed under the supervision of the Cardiology staff. At peak effect of the drug, 30 mCi Tc-9m tetrofosmin was injected intravenously and standard myocardial SPECT imaging was performed. Quantitative gated imaging was also performed to evaluate left ventricular wall motion, and estimate left ventricular ejection fraction. COMPARISON:  None. FINDINGS: Perfusion: No decreased activity in the left ventricle on stress imaging to suggest reversible ischemia or infarction. Wall Motion: Normal left ventricular wall motion. No left ventricular dilation. Left Ventricular Ejection Fraction: 61 % End diastolic volume 107 ml End systolic volume 41 ml IMPRESSION: 1. No reversible ischemia or infarction. 2. Normal left ventricular wall motion. 3. Left ventricular ejection fraction 61% 4. Non invasive risk stratification*: Low *2012 Appropriate Use Criteria for Coronary Revascularization Focused Update: J Am Coll Cardiol. 2012;59(9):857-881. http://[email or web address]?[email or web address] Electronically Signed   By: Genevive Bi M.D.   On: 02/12/2016 11:54    Microbiology: No results found for this or any previous visit (from the past 240 hour(s)).   Labs: Basic Metabolic Panel:  Recent Labs Lab 02/11/16 2200  NA 141  K 3.9  CL 106  CO2 29  GLUCOSE 104*  BUN 12  CREATININE 1.44*  CALCIUM 9.4   Liver Function  Tests:  Recent Labs Lab 02/11/16 2200  AST 24  ALT 34  ALKPHOS 61  BILITOT 0.1*  PROT 7.6  ALBUMIN 4.0   No results for input(s): LIPASE, AMYLASE in the last 168 hours. No results for input(s): AMMONIA in the last 168 hours. CBC:  Recent Labs Lab 02/11/16 2200  WBC 8.4  HGB 13.8  HCT 41.7  MCV 81.4  PLT 213   Cardiac Enzymes:  Recent Labs Lab 02/11/16 2200 02/12/16 0822  TROPONINI <0.03 <0.03   BNP: BNP (last 3 results) No results for input(s): BNP in the last 8760 hours.  ProBNP (last 3 results) No results for input(s): PROBNP in the last 8760 hours.  CBG:  Recent Labs Lab 02/11/16 2200  GLUCAP 105*       Signed:  Jacaria Colburn N  [facility] 02/12/2016, 12:19 PM

## 2016-02-16 LAB — HEMOGLOBIN A1C

## 2019-11-11 ENCOUNTER — Emergency Department (HOSPITAL_BASED_OUTPATIENT_CLINIC_OR_DEPARTMENT_OTHER)
Admission: EM | Admit: 2019-11-11 | Discharge: 2019-11-12 | Disposition: A | Discharge status: Home / Self Care | Payer: Medicaid Other | Attending: Emergency Medicine | Admitting: Emergency Medicine

## 2019-11-11 ENCOUNTER — Emergency Department (HOSPITAL_BASED_OUTPATIENT_CLINIC_OR_DEPARTMENT_OTHER): Payer: Medicaid Other

## 2019-11-11 ENCOUNTER — Encounter (HOSPITAL_BASED_OUTPATIENT_CLINIC_OR_DEPARTMENT_OTHER): Payer: Self-pay | Admitting: *Deleted

## 2019-11-11 ENCOUNTER — Other Ambulatory Visit: Payer: Self-pay

## 2019-11-11 DIAGNOSIS — K5900 Constipation, unspecified: Secondary | ICD-10-CM | POA: Diagnosis not present

## 2019-11-11 DIAGNOSIS — K388 Other specified diseases of appendix: Secondary | ICD-10-CM | POA: Diagnosis not present

## 2019-11-11 DIAGNOSIS — F17210 Nicotine dependence, cigarettes, uncomplicated: Secondary | ICD-10-CM | POA: Diagnosis not present

## 2019-11-11 DIAGNOSIS — Z79899 Other long term (current) drug therapy: Secondary | ICD-10-CM | POA: Insufficient documentation

## 2019-11-11 DIAGNOSIS — R1032 Left lower quadrant pain: Secondary | ICD-10-CM | POA: Diagnosis present

## 2019-11-11 DIAGNOSIS — K6389 Other specified diseases of intestine: Secondary | ICD-10-CM

## 2019-11-11 HISTORY — DX: Unspecified abdominal hernia without obstruction or gangrene: K46.9

## 2019-11-11 LAB — URINALYSIS, ROUTINE W REFLEX MICROSCOPIC
Bilirubin Urine: NEGATIVE
Glucose, UA: NEGATIVE mg/dL
Hgb urine dipstick: NEGATIVE
Ketones, ur: NEGATIVE mg/dL
Leukocytes,Ua: NEGATIVE
Nitrite: NEGATIVE
Protein, ur: NEGATIVE mg/dL
Specific Gravity, Urine: >1.03 — ABNORMAL HIGH (ref 1.005–1.030)
pH: 6 (ref 5.0–8.0)

## 2019-11-11 MED ORDER — MORPHINE SULFATE (PF) 4 MG/ML IV SOLN
4.0000 mg | Freq: Once | INTRAVENOUS | Status: AC
  Administered 2019-11-12: 4 mg via INTRAVENOUS
  Filled 2019-11-11: qty 1

## 2019-11-11 MED ORDER — ONDANSETRON HCL 4 MG/2ML IJ SOLN
4.0000 mg | Freq: Once | INTRAMUSCULAR | Status: AC
  Administered 2019-11-12: 4 mg via INTRAVENOUS
  Filled 2019-11-11: qty 2

## 2019-11-11 MED ORDER — SODIUM CHLORIDE 0.9 % IV BOLUS
1000.0000 mL | Freq: Once | INTRAVENOUS | Status: AC
  Administered 2019-11-12: 1000 mL via INTRAVENOUS

## 2019-11-11 NOTE — ED Provider Notes (Signed)
[facility] Provider Note   CSN: [id number] Arrival date & time: 11/11/19  2009     History Chief Complaint  Patient presents with   Abdominal Pain    Jonathan Mata is a 39 y.o. male.  Patient presents to the emergency department for evaluation of abdominal pain.  Patient reports that he has been experiencing persistent pain in the left lower abdomen for 3 days.  He has felt constipated but has not had any fever, nausea, vomiting or diarrhea.        Past Medical History:  Diagnosis Date   Hernia, abdominal     Patient Active Problem List   Diagnosis Date Noted   Chest pain 02/12/2016   AKI (acute kidney injury) ([place]) 02/12/2016    History reviewed. No pertinent surgical history.     Family History  Problem Relation Age of Onset   Heart murmur Mother    Hypertension Mother     Social History   Tobacco Use   Smoking status: Current Some Day Smoker   Smokeless tobacco: Never Used  Substance Use Topics   Alcohol use: No   Drug use: No    Home Medications Prior to Admission medications   Medication Sig Start Date End Date Taking? Authorizing Provider  acetaminophen (TYLENOL) 325 MG tablet Take 2 tablets (650 mg total) by mouth every 4 (four) hours as needed for headache or mild pain. 02/12/16   Kinnie Feil, MD  atorvastatin (LIPITOR) 20 MG tablet Take 20 mg by mouth daily. 09/25/19   [provider]  docusate sodium (COLACE) 100 MG capsule Take 1 capsule (100 mg total) by mouth every 12 (twelve) hours. 11/12/19   Orpah Greek, MD  hydrochlorothiazide (MICROZIDE) 12.5 MG capsule Take 12.5 mg by mouth every morning. 09/25/19   [provider]  HYDROcodone-acetaminophen (NORCO/VICODIN) 5-325 MG tablet Take 1 tablet by mouth every 4 (four) hours as needed for moderate pain. 11/12/19   Orpah Greek, MD  omeprazole (PRILOSEC) 40 MG capsule Take 1 capsule (40 mg total) by mouth daily.  02/12/16   Kinnie Feil, MD    Allergies    Patient has no known allergies.  Review of Systems   Review of Systems  Gastrointestinal: Positive for abdominal pain.  All other systems reviewed and are negative.   Physical Exam Updated Vital Signs BP 128/78 (BP Location: Left Arm)    Pulse 82    Temp 98.9 F (37.2 C) (Oral)    Resp 18    Ht 5\' 11"  (1.803 m)    Wt (!) 138.3 kg    SpO2 95%    BMI 42.54 kg/m   Physical Exam Vitals and nursing note reviewed.  Constitutional:      General: He is not in acute distress.    Appearance: Normal appearance. He is well-developed.  HENT:     Head: Normocephalic and atraumatic.     Right Ear: Hearing normal.     Left Ear: Hearing normal.     Nose: Nose normal.  Eyes:     Conjunctiva/sclera: Conjunctivae normal.     Pupils: Pupils are equal, round, and reactive to light.  Cardiovascular:     Rate and Rhythm: Regular rhythm.     Heart sounds: S1 normal and S2 normal. No murmur. No friction rub. No gallop.   Pulmonary:     Effort: Pulmonary effort is normal. No respiratory distress.     Breath sounds: Normal breath sounds.  Chest:  Chest wall: No tenderness.  Abdominal:     General: Bowel sounds are normal.     Palpations: Abdomen is soft.     Tenderness: There is abdominal tenderness in the left lower quadrant. There is no guarding or rebound. Negative signs include Murphy's sign and McBurney's sign.     Hernia: No hernia is present.  Musculoskeletal:        General: Normal range of motion.     Cervical back: Normal range of motion and neck supple.  Skin:    General: Skin is warm and dry.     Findings: No rash.  Neurological:     Mental Status: He is alert and oriented to person, place, and time.     GCS: GCS eye subscore is 4. GCS verbal subscore is 5. GCS motor subscore is 6.     Cranial Nerves: No cranial nerve deficit.     Sensory: No sensory deficit.     Coordination: Coordination normal.  Psychiatric:        Speech:  Speech normal.        Behavior: Behavior normal.        Thought Content: Thought content normal.     ED Results / Procedures / Treatments   Labs (all labs ordered are listed, but only abnormal results are displayed) Labs Reviewed  URINALYSIS, ROUTINE W REFLEX MICROSCOPIC - Abnormal; Notable for the following components:      Result Value   Specific Gravity, Urine >1.030 (*)    All other components within normal limits  CBC WITH DIFFERENTIAL/PLATELET - Abnormal; Notable for the following components:   MCH 25.7 (*)    All other components within normal limits  BASIC METABOLIC PANEL - Abnormal; Notable for the following components:   Glucose, Bld 111 (*)    Creatinine, Ser 1.26 (*)    Calcium 8.7 (*)    All other components within normal limits    EKG None  Radiology DG Abdomen 1 View  Result Date: 11/11/2019 CLINICAL DATA:  Constipation with left lower abdominal pain x3 days. EXAM: ABDOMEN - 1 VIEW COMPARISON:  None. FINDINGS: The bowel gas pattern is normal. A moderate amount of stool is seen throughout the large bowel. No radio-opaque calculi or other significant radiographic abnormality are seen. IMPRESSION: Moderate stool burden without evidence of bowel obstruction. Electronically Signed   By: Aram Candela M.D.   On: 11/11/2019 20:37   CT ABDOMEN PELVIS W CONTRAST  Result Date: 11/12/2019 CLINICAL DATA:  Left lower abdominal pain for 3 days. Diverticulitis suspected. EXAM: CT ABDOMEN AND PELVIS WITH CONTRAST TECHNIQUE: Multidetector CT imaging of the abdomen and pelvis was performed using the standard protocol following bolus administration of intravenous contrast. CONTRAST:  OMNIPAQUE IOHEXOL 300 MG/ML  SOLN COMPARISON:  Abdominal radiograph yesterday. FINDINGS: Lower chest: Lung bases are clear. Hepatobiliary: Liver is enlarged spanning 20 cm cranial caudal. Diffusely decreased hepatic density consistent with steatosis. Focal fatty sparing adjacent the gallbladder  fossa. No focal hepatic lesion. Gallbladder physiologically distended, no calcified stone. No biliary dilatation. Pancreas: No ductal dilatation or inflammation. Spleen: Normal in size without focal abnormality. Adrenals/Urinary Tract: Normal adrenal glands. No hydronephrosis or perinephric edema. Homogeneous renal enhancement. Urinary bladder is physiologically distended without wall thickening. Stomach/Bowel: Inflamed fat lobule arising from the anti mesenteric border of the sigmoid colon consistent with acute epiploic appendagitis. No associated colonic wall thickening. No significant diverticular disease. Small volume of stool throughout the colon. Moderate-sized ventral abdominal wall hernia contains nonobstructed noninflamed  transverse colon. Hernia neck is bands 5.7 cm transverse. Normal appendix. Small bowel is unremarkable without obstruction or inflammation. Tiny hiatal hernia, stomach is otherwise normal. Vascular/Lymphatic: Normal caliber abdominal aorta. The portal vein is patent. No enlarged lymph nodes in the abdomen or pelvis. Reproductive: Minimal central prostatic calcifications. Other: Supraumbilical ventral abdominal wall hernia containing nonobstructed or inflamed transverse colon. Tiny fat containing umbilical hernia. No free air nor free fluid in the abdomen or pelvis. Musculoskeletal: Bilateral L5 pars interarticularis defects without listhesis. There are no acute or suspicious osseous abnormalities. IMPRESSION: 1. Acute epiploic appendagitis arising from the anti mesenteric border of the sigmoid colon. 2. Moderate-sized ventral abdominal wall hernia containing nonobstructed or inflamed transverse colon. 3. Hepatomegaly and hepatic steatosis. 4. Bilateral L5 pars interarticularis defects without listhesis. Electronically Signed   By: Narda Rutherford M.D.   On: 11/12/2019 00:55    Procedures Procedures (including critical care time)  Medications Ordered in ED Medications  sodium  chloride 0.9 % bolus 1,000 mL (1,000 mLs Intravenous New Bag/Given 11/12/19 0004)  morphine 4 MG/ML injection 4 mg (4 mg Intravenous Given 11/12/19 0005)  ondansetron (ZOFRAN) injection 4 mg (4 mg Intravenous Given 11/12/19 0005)  iohexol (OMNIPAQUE) 300 MG/ML solution 100 mL (100 mLs Intravenous Contrast Given 11/12/19 0042)    ED Course  I have reviewed the triage vital signs and the nursing notes.  Pertinent labs & imaging results that were available during my care of the patient were reviewed by me and considered in my medical decision making (see chart for details).    MDM Rules/Calculators/A&P                      Patient presents to the emergency department for evaluation of abdominal pain.  He has been experiencing left lower quadrant abdominal pain for 3 days.  Examination did reveal left lower quadrant tenderness but no guarding and no signs of peritonitis.  Lab work was reassuring but given the presentation of symptoms, diverticulitis was considered.  Patient underwent CT scan which did not show acute diverticulitis but rather epiploic appendagitis.  Patient will be treated with stool softeners, analgesia, follow-up as needed.  Final Clinical Impression(s) / ED Diagnoses Final diagnoses:  Epiploic appendagitis    Rx / DC Orders ED Discharge Orders         Ordered    docusate sodium (COLACE) 100 MG capsule  Every 12 hours     11/12/19 0112    HYDROcodone-acetaminophen (NORCO/VICODIN) 5-325 MG tablet  Every 4 hours PRN     11/12/19 0112           Gilda Crease, MD 11/12/19 [phone]

## 2019-11-11 NOTE — ED Triage Notes (Signed)
Pt c/o left lower abd pain x 3 days , ? Constipation , pt denies n/v/d. HX  Large hernia

## 2019-11-12 LAB — BASIC METABOLIC PANEL
Anion gap: 12 (ref 5–15)
BUN: 15 mg/dL (ref 6–20)
CO2: 28 mmol/L (ref 22–32)
Calcium: 8.7 mg/dL — ABNORMAL LOW (ref 8.9–10.3)
Chloride: 99 mmol/L (ref 98–111)
Creatinine, Ser: 1.26 mg/dL — ABNORMAL HIGH (ref 0.61–1.24)
GFR calc Af Amer: >60 mL/min (ref >60)
GFR calc non Af Amer: >60 mL/min (ref >60)
Glucose, Bld: 111 mg/dL — ABNORMAL HIGH (ref 70–99)
Potassium: 3.5 mmol/L (ref 3.5–5.1)
Sodium: 139 mmol/L (ref 135–145)

## 2019-11-12 LAB — CBC WITH DIFFERENTIAL/PLATELET
Abs Immature Granulocytes: 0.02 10*3/uL (ref 0.00–0.07)
Basophils Absolute: 0 10*3/uL (ref 0.0–0.1)
Basophils Relative: 0 %
Eosinophils Absolute: 0.2 10*3/uL (ref 0.0–0.5)
Eosinophils Relative: 2 %
HCT: 42 % (ref 39.0–52.0)
Hemoglobin: 13.4 g/dL (ref 13.0–17.0)
Immature Granulocytes: 0 %
Lymphocytes Relative: 31 %
Lymphs Abs: 3.2 10*3/uL (ref 0.7–4.0)
MCH: 25.7 pg — ABNORMAL LOW (ref 26.0–34.0)
MCHC: 31.9 g/dL (ref 30.0–36.0)
MCV: 80.6 fL (ref 80.0–100.0)
Monocytes Absolute: 0.7 10*3/uL (ref 0.1–1.0)
Monocytes Relative: 7 %
Neutro Abs: 6 10*3/uL (ref 1.7–7.7)
Neutrophils Relative %: 60 %
Platelets: 225 10*3/uL (ref 150–400)
RBC: 5.21 MIL/uL (ref 4.22–5.81)
RDW: 14 % (ref 11.5–15.5)
WBC: 10.1 10*3/uL (ref 4.0–10.5)
nRBC: 0 % (ref 0.0–0.2)

## 2019-11-12 MED ORDER — DOCUSATE SODIUM 100 MG PO CAPS: 100.0000 mg | ORAL_CAPSULE | Freq: Two times a day (BID) | ORAL | 0 refills | Status: AC

## 2019-11-12 MED ORDER — HYDROCODONE-ACETAMINOPHEN 5-325 MG PO TABS: 1.0000 | ORAL_TABLET | ORAL | 0 refills | Status: AC | PRN

## 2019-11-12 MED ORDER — IOHEXOL 300 MG/ML  SOLN
100.0000 mL | Freq: Once | INTRAMUSCULAR | Status: AC
  Administered 2019-11-12: 100 mL via INTRAVENOUS

## 2021-11-06 IMAGING — CT CT ABD-PELV W/ CM
2 of 4 series · 16 of 46 positions shown, 18 images · IV contrast (Omnipaque)
Comparison: Abdominal radiograph yesterday.

CLINICAL DATA: Left lower abdominal pain for 3 days. Diverticulitis
suspected.

EXAM:
CT ABDOMEN AND PELVIS WITH CONTRAST
TECHNIQUE: Multidetector CT imaging of the abdomen and pelvis was performed
using the standard protocol following bolus administration of
intravenous contrast.
CONTRAST:  100mL OMNIPAQUE IOHEXOL 300 MG/ML  SOLN

[Series 2: axial st · axial · 0.86mm/px · z∈[-577,-122]mm · 13 of 99 slices shown, 15 images]
[im 4/99  soft-tissue]
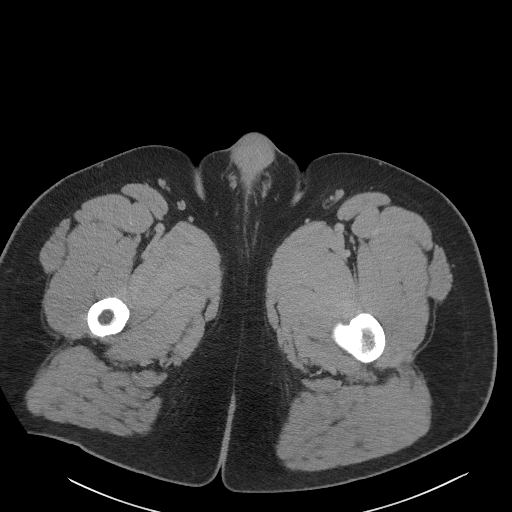
[im 4/99  bone]
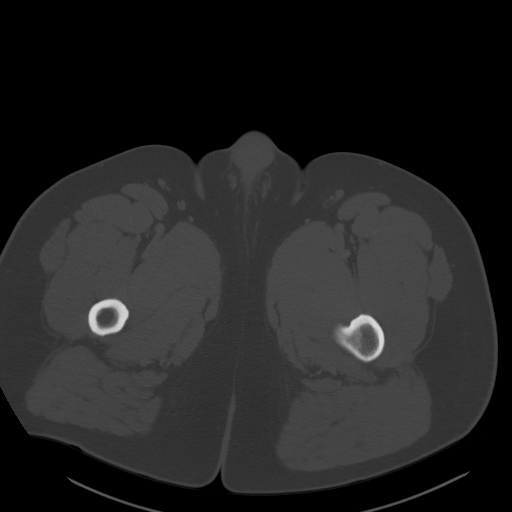
[im 12/99  soft-tissue]
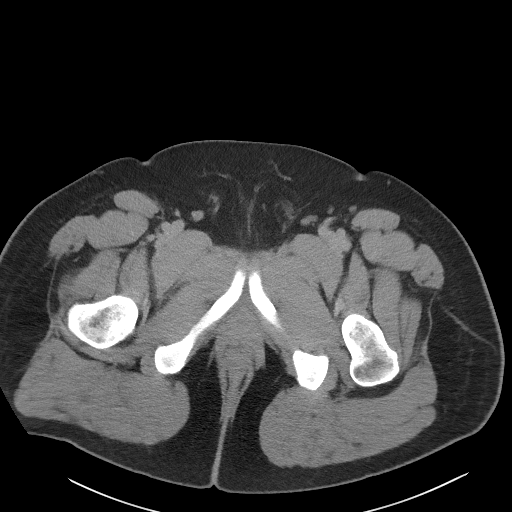
[im 20/99  soft-tissue]
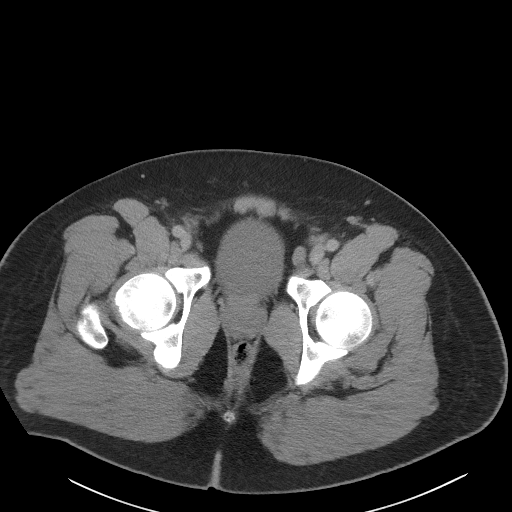
[im 28/99  soft-tissue]
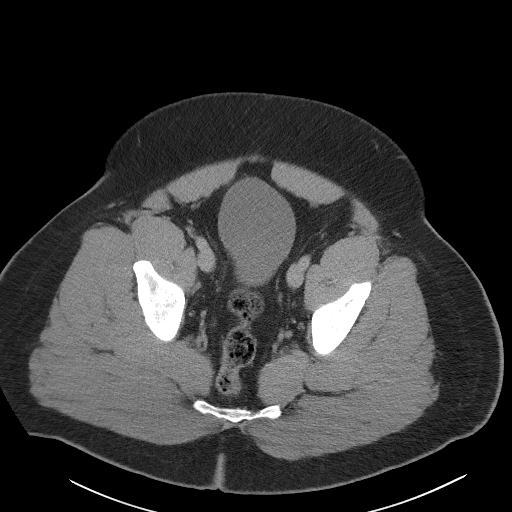
[im 36/99  soft-tissue]
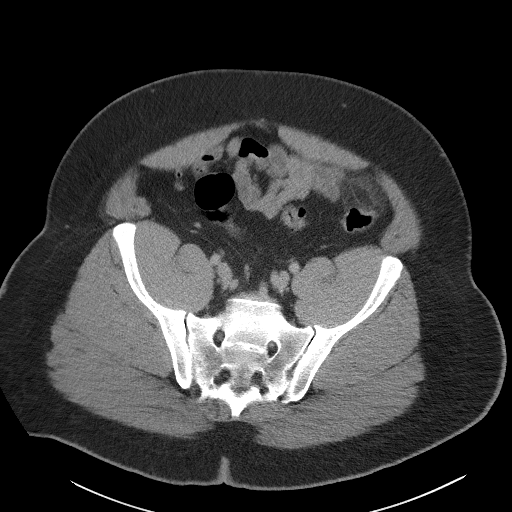
[im 44/99  soft-tissue]
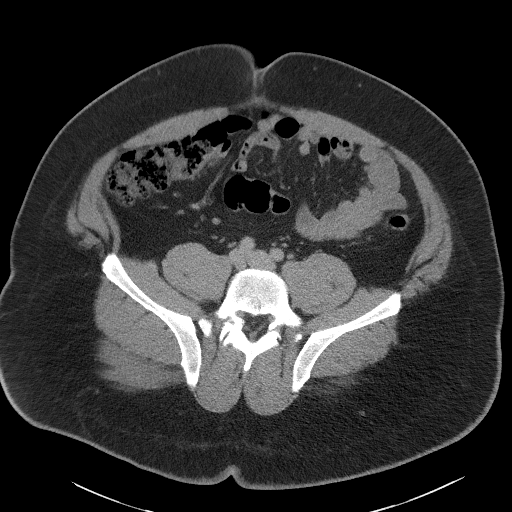
[im 51/99  soft-tissue]
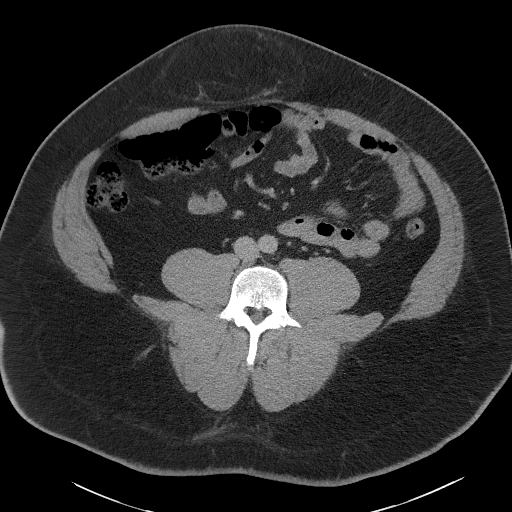
[im 55/99  soft-tissue]
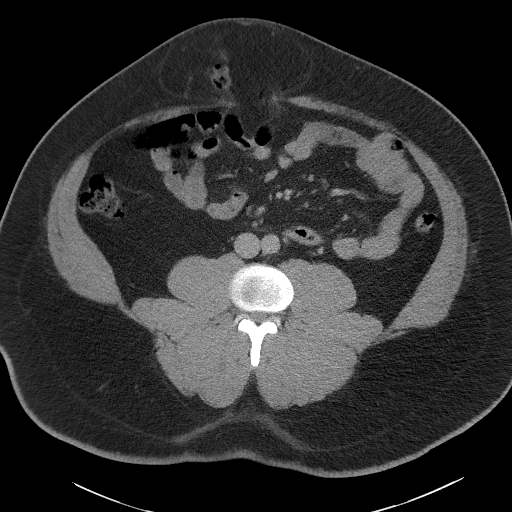
[im 63/99  soft-tissue]
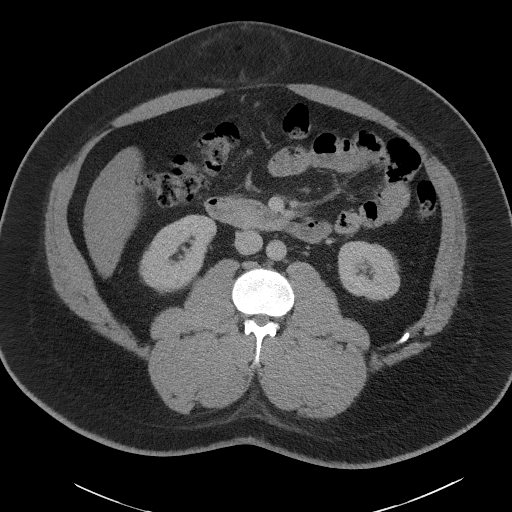
[im 63/99  bone]
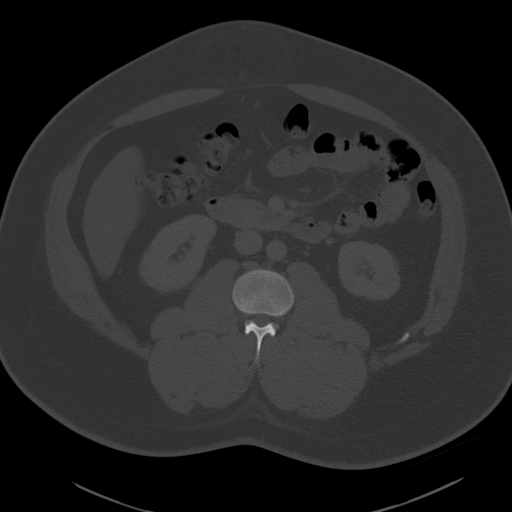
[im 71/99  soft-tissue]
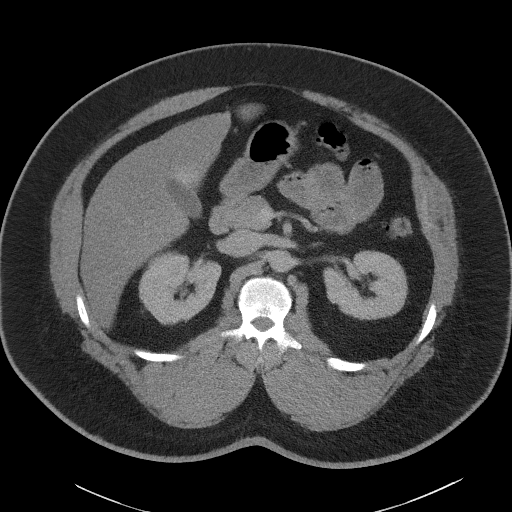
[im 79/99  soft-tissue]
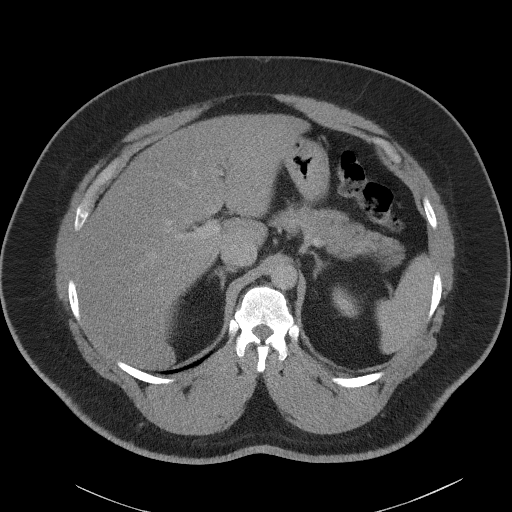
[im 87/99  soft-tissue]
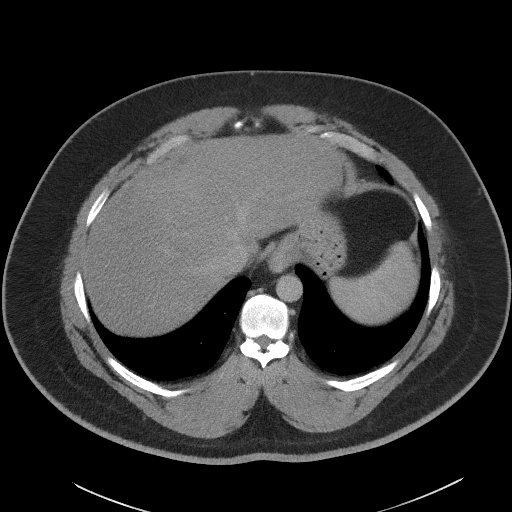
[im 95/99  soft-tissue]
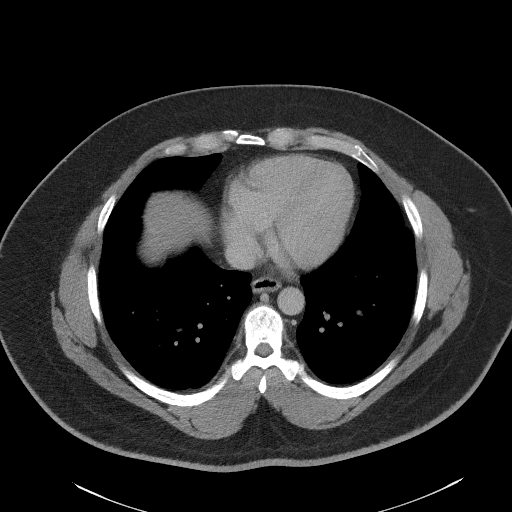

[Series 5: coronal st · coronal · 0.84mm/px · 3 of 137 slices shown]
[im 46/137  soft-tissue]
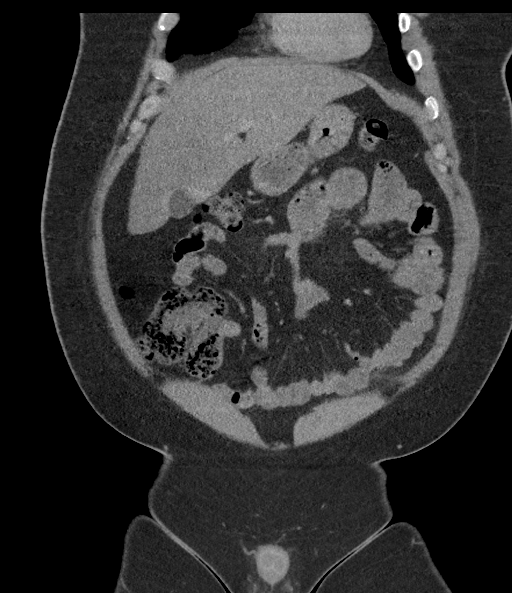
[im 61/137  soft-tissue]
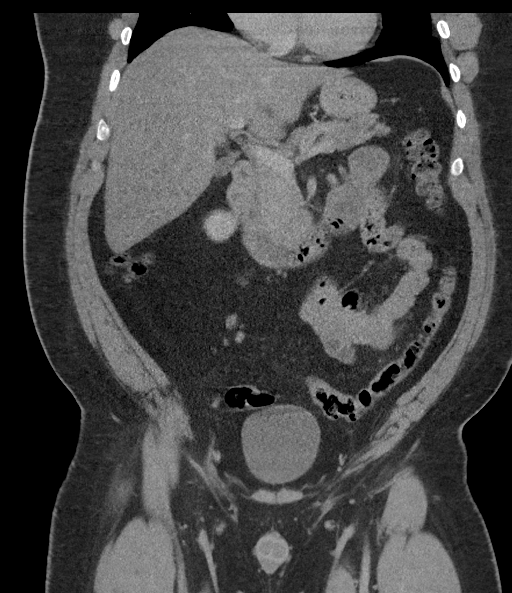
[im 76/137  soft-tissue]
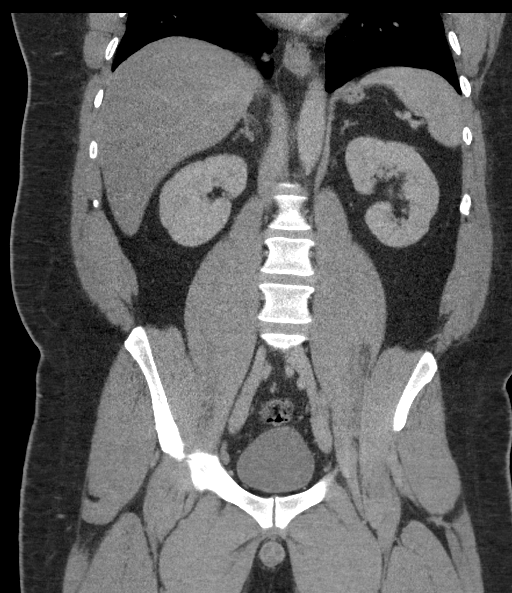

[16 of 46 positions shown; findings below may reference images not displayed]

FINDINGS: Lower chest: Lung bases are clear.

Hepatobiliary: Liver is enlarged spanning 20 cm cranial caudal.
Diffusely decreased hepatic density consistent with steatosis. Focal
fatty sparing adjacent the gallbladder fossa. No focal hepatic
lesion. Gallbladder physiologically distended, no calcified stone.
No biliary dilatation.

Pancreas: No ductal dilatation or inflammation.

Spleen: Normal in size without focal abnormality.

Adrenals/Urinary Tract: Normal adrenal glands. No hydronephrosis or
perinephric edema. Homogeneous renal enhancement. Urinary bladder is
physiologically distended without wall thickening.

Stomach/Bowel: Inflamed fat lobule arising from the anti mesenteric
border of the sigmoid colon consistent with acute epiploic
appendagitis. No associated colonic wall thickening. No significant
diverticular disease. Small volume of stool throughout the colon.
Moderate-sized ventral abdominal wall hernia contains nonobstructed
noninflamed transverse colon. Hernia neck is bands 5.7 cm
transverse. Normal appendix. Small bowel is unremarkable without
obstruction or inflammation. Tiny hiatal hernia, stomach is
otherwise normal.

Vascular/Lymphatic: Normal caliber abdominal aorta. The portal vein
is patent. No enlarged lymph nodes in the abdomen or pelvis.

Reproductive: Minimal central prostatic calcifications.

Other: Supraumbilical ventral abdominal wall hernia containing
nonobstructed or inflamed transverse colon. Tiny fat containing
umbilical hernia. No free air nor free fluid in the abdomen or
pelvis.

Musculoskeletal: Bilateral L5 pars interarticularis defects without
listhesis. There are no acute or suspicious osseous abnormalities.
IMPRESSION: 1. Acute epiploic appendagitis arising from the anti mesenteric
border of the sigmoid colon.
2. Moderate-sized ventral abdominal wall hernia containing
nonobstructed or inflamed transverse colon.
3. Hepatomegaly and hepatic steatosis.
4. Bilateral L5 pars interarticularis defects without listhesis.
